# Patient Record
Sex: Male | Born: 1985 | Race: White | Hispanic: No | Marital: Single | Smoking: Former smoker
Health system: Southern US, Academic
[De-identification: ages and names within clinical notes are randomized; demographics above are authoritative.]

## PROBLEM LIST (undated history)

## (undated) ENCOUNTER — Emergency Department (HOSPITAL_COMMUNITY): Admission: EM | Payer: BC Managed Care – PPO | Source: Home / Self Care

## (undated) DIAGNOSIS — R7303 Prediabetes: Secondary | ICD-10-CM

## (undated) HISTORY — PX: HX APPENDECTOMY: SHX54

---
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
## 2011-01-01 ENCOUNTER — Emergency Department (EMERGENCY_DEPARTMENT_HOSPITAL): Payer: BC Managed Care – PPO

## 2011-01-01 ENCOUNTER — Emergency Department
Admission: EM | Admit: 2011-01-01 | Discharge: 2011-01-01 | Disposition: A | Discharge status: Home / Self Care | Payer: BC Managed Care – PPO | Attending: Emergency Medicine-WVUH | Admitting: Emergency Medicine-WVUH

## 2011-01-01 LAB — URINALYSIS MACROSCOPIC WITH REFLEX TO MICROSCOPIC URINALYSIS (CULTURE NOT PERFORMED)
BILIRUBIN: NEGATIVE
BLOOD: NEGATIVE
GLUCOSE: NEGATIVE mg/dL
KETONES: NEGATIVE mg/dL
LEUKOCYTES: NEGATIVE
NITRITE: NEGATIVE
PH URINE: 7 (ref 5.0–8.0)
PROTEIN: NEGATIVE mg/dL
SPECIFIC GRAVITY, URINE: 1.016 (ref 1.005–1.030)
UROBILINOGEN: NORMAL mg/dL

## 2011-01-01 MED ORDER — LIDOCAINE HCL 10 MG/ML (1 %) INJECTION SOLUTION
250.0000 mg | Freq: Once | INTRAMUSCULAR | Status: DC
Start: 2011-01-01 — End: 2011-01-01
  Filled 2011-01-01: qty 2.5

## 2011-01-01 MED ORDER — LIDOCAINE HCL 10 MG/ML (1 %) INJECTION SOLUTION
250.0000 mg | Freq: Once | INTRAMUSCULAR | Status: AC
Start: 2011-01-01 — End: 2011-01-01
  Administered 2011-01-01: 250 mg via INTRAMUSCULAR
  Filled 2011-01-01: qty 2.5

## 2011-01-01 MED ORDER — HYDROCODONE 5 MG-ACETAMINOPHEN 325 MG TABLET
ORAL_TABLET | ORAL | Status: AC
Start: 2011-01-01 — End: 2011-01-01
  Administered 2011-01-01: 1 via ORAL
  Filled 2011-01-01: qty 1

## 2011-01-01 MED ORDER — HYDROCODONE 5 MG-ACETAMINOPHEN 325 MG TABLET
1.0000 | ORAL_TABLET | Freq: Once | ORAL | Status: AC
Start: 2011-01-01 — End: 2011-01-01

## 2011-01-01 MED ORDER — DOXYCYCLINE HYCLATE 100 MG CAPSULE
100.00 mg | ORAL_CAPSULE | Freq: Two times a day (BID) | ORAL | Status: AC
Start: 2011-01-01 — End: 2011-01-11

## 2011-01-01 MED ORDER — HYDROCODONE 5 MG-ACETAMINOPHEN 325 MG TABLET
1.00 | ORAL_TABLET | ORAL | Status: DC | PRN
Start: 2011-01-01 — End: 2012-08-17

## 2011-01-01 NOTE — ED Provider Notes (Addendum)
The following note was written per dictation of Marye Round, PA-C.  Bradd Burner, SCRIBE  Attending: Sheliah Hatch  CC: R groin pain  HPI  25 yo M reports to ED via private vehicle c/o R groin pain. Onset 5 days ago. Pain is pressure-like in nature and associated with dysuria, increased urinary frequency, and urinary urgency. Pt denies penile discharge and scrotal swelling. He does report a swollen nodule on his scrotum. Pt also reports RUQ abdominal pain and nausea for which he was evaluated in an outside facility. No fever, chills, or muscle aches. Pt reports that he is sexually active and has unprotected sex, but only with his girlfriend.       Review of Systems  Constitutional: No fever, chills or muscle aches  Skin: No rash or diaphoresis  GI:  No vomiting. +Nausea, +RUQ abdominal pain  GU:  +R groin pain +Dysuria +Increased Frequency. +Increased Urgency. No penile discharge. No scrotal swelling.          History:   PMH: Denies  PSH: Appendectomy  Social: Pt is sexually active and has unprotected sex, but reports only with his girlfriend.   Allergies: NKDA    Physical Exam  Nursing notes reviewed.    ED Triage Vitals   Enc Vitals Group      BP (Non-Invasive) 01/01/11 1037 106/62 mmHg      Heart Rate 01/01/11 1037 69       Respiratory Rate 01/01/11 1037 16       Temperature 01/01/11 1037 36.8 C (98.2 F)      Temp src --       SpO2-1 01/01/11 1037 97 %      Weight 01/01/11 1037 72.576 kg (160 lb)      Height 01/01/11 1037 1.702 m (5\' 7" )      Head Cir --       Peak Flow --       Pain Score --       Pain Loc --       Pain Edu? --       Excl. in GC? --      Constitutional: NAD. Oriented  Cardiovascular: RRR, No murmurs, rubs or gallops. Intact distal pulses.  Pulmonary/Chest: BS equal bilaterally. No respiratory distress. No wheezes, rales or chest tenderness.   Abdominal: BS +. Abdomen soft. RLQ tenderness without rebound or guarding.               Musculoskeletal: No edema, tenderness or deformity.   GU: No nodule noted. TTP along spermatic chord and R epididymis. No palpable hernia. Scrotum is not swollen. No discoloration or rash.   Skin: warm and dry. No rash, erythema, pallor or cyanosis. Refer to GU.   Psychiatric: normal mood and affect. Behavior is normal.   Neurological: Alert. Grossly intact.        Course  MDM      Impression/Plan: R groin pain concerning for epididymitis and urethritis possibly secondary to STD. Will obtain UA with culture and GC test. Formal US of scrotum ordered.      Orders Placed This Encounter   . URINE CULTURE,ROUTINE   . US SCROTUM   . NEISSERIA GONORRHOEAE DNA BY PCR   . CHLAMYDIA / GU BY PCR   . CANCELED: URINALYSIS W/CULTURE (INPT ROUTINE)   . URINALYSIS W/CULTURE (INPT ROUTINE)   . HYDROcodone-acetaminophen (NORCO) 5-325 mg per tablet   . hydrocodone-Acetaminophen (NORCO) tablet ---[employer]  Reviewed   URINALYSIS W/CULTURE (INPT ROUTINE)   NEISSERIA GONORRHOEAE DNA BY PCR   CHLAMYDIA TRACHOMITIS DNA BY PCR (INHOUSE)     All labs were reviewed.    [place]: No masses. Normal blood flow to both testicles. Epididymis normal size bilaterally.   Fluid around both testicles. No hernia. Cystic head of L epididymis.     Will still treat as an STD. Will give a shot of Rocephin and Doxycycline as a prescription.    Pt will be discharged to home. Pt was instructed to return to the ED for any worsening signs or symptoms. Prescriptions given for Norco and Doxycycline. Instructions about epididymitis provided. Follow up with PCP in 1 week if no improvement.    Clinical Impression: epididymitis      I have reviewed and verified the information in the above note is an accurate dictation of the information supplied to [facility], ED Scribe.  Harlin Heys, PA-C 01/01/2011, 12:30 PM       I was present and available to evaluate this patient while in the emergency department. This is not an attestation of having personally seen and evaluated the patient, but I was available for consultation.  Odis Luster, MD 01/02/2011, 2:49 PM

## 2011-01-01 NOTE — ED Nurses Note (Signed)
Pt medicated as ordered, Pt given d/c instructions, denies any further questions at this time

## 2011-01-01 NOTE — Discharge Instructions (Signed)
Follow up with your primary care provider in 1 week. Return to ED if you cannot follow up with your primary care provider if symptoms do not resolve or worsen. Take medication as prescribed.

## 2011-01-01 NOTE — ED Nurses Note (Signed)
Pt medicated as ordered for 7/10 pain to testicle, denies any further needs at this time,

## 2011-01-03 LAB — CHLAMYDIA TRACHOMITIS DNA BY PCR (INHOUSE)

## 2011-01-03 LAB — NEISSERIA GONORRHOEAE DNA BY PCR

## 2012-08-17 ENCOUNTER — Encounter (INDEPENDENT_AMBULATORY_CARE_PROVIDER_SITE_OTHER): Payer: Self-pay

## 2012-08-17 ENCOUNTER — Ambulatory Visit (INDEPENDENT_AMBULATORY_CARE_PROVIDER_SITE_OTHER): Payer: MEDICAID

## 2012-08-17 VITALS — BP 130/76 | HR 72 | Temp 98.1°F | Resp 16 | Ht 68.27 in | Wt 171.1 lb

## 2012-08-17 MED ORDER — ANTIPYRINE-BENZOCAINE 5.4 %-1.4 % EAR DROPS
OTIC | Status: DC
Start: 2012-08-17 — End: 2015-12-27

## 2012-08-17 MED ORDER — AMOXICILLIN 875 MG-POTASSIUM CLAVULANATE 125 MG TABLET
1.00 | ORAL_TABLET | Freq: Two times a day (BID) | ORAL | Status: DC
Start: 2012-08-17 — End: 2015-12-27

## 2012-08-17 NOTE — Progress Notes (Signed)
Subjective:     Patient ID:  Joshua Jefferson is an 27 y.o. male     Chief Complaint:    Chief Complaint   Patient presents with   . Sinus Pressure   . Ear Pain       Patient is a 27 y.o. male presenting with ear pain and congestion.   Ear Pain   There is pain in both ears. The current episode started 1 to 4 weeks ago. The problem has been unchanged. There has been no fever. The pain is mild. Associated symptoms include coughing and headaches. Pertinent negatives include no diarrhea, rash, sore throat or vomiting. The treatment provided no relief.   Nasal Congestion  This is a new problem. The current episode started more than 1 week ago. The problem has been gradually worsening. Associated symptoms include headaches. Pertinent negatives include no chest pain and no shortness of breath.       Review of Systems   Constitutional: Negative for fever and chills.   HENT: Positive for ear pain and congestion. Negative for sore throat.    Eyes: Negative for pain, discharge and redness.   Respiratory: Positive for cough. Negative for sputum production, shortness of breath and wheezing.    Cardiovascular: Negative for chest pain.   Gastrointestinal: Negative for nausea, vomiting and diarrhea.   Skin: Negative for itching and rash.   Neurological: Positive for headaches.       Objective:     Physical Exam   Constitutional: He appears well-developed and well-nourished.   HENT:   Head: Normocephalic.   Right Ear: Hearing, tympanic membrane, external ear and ear canal normal.   Left Ear: Hearing, tympanic membrane, external ear and ear canal normal.   Nose: Right sinus exhibits maxillary sinus tenderness. Left sinus exhibits maxillary sinus tenderness.   Mouth/Throat: Uvula is midline, oropharynx is clear and moist and mucous membranes are normal. No oropharyngeal exudate or posterior oropharyngeal erythema.   Eyes: Conjunctivae and EOM are normal.   Neck: Normal range of motion. Neck supple.    Cardiovascular: Normal rate, regular rhythm and normal heart sounds.    Pulm:  Effort normal and breath sounds normal.  Observations:  no respiratory distress.    There are no rales present.    There are no wheezes present.    Neurological: He is alert.   Skin: Skin is warm.       .    Vital signs:        Filed Vitals:    08/17/12 1635   BP: 130/76   Pulse: 72   Temp: 36.7 C (98.1 F)   TempSrc: Tympanic   Resp: 16   Height: 1.734 m (5' 8.27")   Weight: 77.61 kg (171 lb 1.6 oz)   SpO2: 98%       I reviewed and confirmed the patient's past medical history taken by the nurse or medical assistant with the addition of the following:    Past Medical History:         History reviewed. No pertinent past medical history.  Past Surgical History:          Past Surgical History   Procedure Laterality Date   . Hx appendectomy       Allergies:      No Known Allergies  Medications:         Current Outpatient Prescriptions   Medication Sig   . amoxicillin-pot clavulanate (AUGMENTIN) 875-125 mg Oral Tablet Take 1 Tab by mouth  Every 12 hours   . antipyrine-benzocaine (AURALGAN) 5.4-1.4 % Otic Drops Instill 3 drops to affected ear every 8 hrs as needed for pain   . [DISCONTINUED] hydrocodone-Acetaminophen (NORCO) 5-325 mg Oral Tablet tablet take 1 Tab by mouth Every 4 hours as needed for Pain.     Social History:         History   Substance Use Topics   . Smoking status: Never Smoker    . Smokeless tobacco: Not on file   . Alcohol Use: No     Family History:       Family History   Problem Relation Age of Onset   . Healthy Mother    . Healthy Father        Data Reviewed     Not applicable    Course:      Condition at discharge: Good     Differential Diagnosis:       Sinusitis vs otitis media vs URI    Orders Placed This Encounter   . amoxicillin-pot clavulanate (AUGMENTIN) 875-125 mg Oral Tablet   . antipyrine-benzocaine (AURALGAN) 5.4-1.4 % Otic Drops              Assessment & Plan:      1. Sinusitis (473.9)  amoxicillin-pot clavulanate (AUGMENTIN) 875-125 mg Oral Tablet   2. Otalgia (388.70)  antipyrine-benzocaine (AURALGAN) 5.4-1.4 % Otic Drops       Go to Emergency Department immediately for further work up if any concerning symptoms.  Plan was discussed and patient verbalized understanding.  If symptoms are worsening or not improving the patient should return to the Urgent Care for further evaluation.  Primus Bravo, MD 08/17/2012, 4:51 PM

## 2015-12-26 DIAGNOSIS — I499 Cardiac arrhythmia, unspecified: Secondary | ICD-10-CM

## 2015-12-26 MED ORDER — SODIUM CHLORIDE 0.9 % (FLUSH) INJECTION SYRINGE
2.00 mL | INJECTION | INTRAMUSCULAR | Status: DC | PRN
Start: 2015-12-26 — End: 2015-12-27

## 2015-12-26 MED ORDER — SODIUM CHLORIDE 0.9 % (FLUSH) INJECTION SYRINGE
2.00 mL | INJECTION | Freq: Three times a day (TID) | INTRAMUSCULAR | Status: DC
Start: 2015-12-27 — End: 2015-12-27
  Administered 2015-12-27: 2 mL
  Administered 2015-12-27: 0 mL

## 2015-12-27 ENCOUNTER — Observation Stay (HOSPITAL_BASED_OUTPATIENT_CLINIC_OR_DEPARTMENT_OTHER): Payer: Self-pay

## 2015-12-27 ENCOUNTER — Encounter (HOSPITAL_COMMUNITY): Payer: Self-pay

## 2015-12-27 ENCOUNTER — Emergency Department (EMERGENCY_DEPARTMENT_HOSPITAL): Payer: Self-pay

## 2015-12-27 ENCOUNTER — Observation Stay
Admission: EM | Admit: 2015-12-27 | Discharge: 2015-12-27 | Disposition: A | Discharge status: Home / Self Care | Payer: Medicaid Other | Attending: Internal Medicine | Admitting: Internal Medicine

## 2015-12-27 ENCOUNTER — Observation Stay (HOSPITAL_COMMUNITY): Payer: Self-pay | Admitting: Internal Medicine

## 2015-12-27 DIAGNOSIS — R0789 Other chest pain: Secondary | ICD-10-CM | POA: Diagnosis present

## 2015-12-27 DIAGNOSIS — Z832 Family history of diseases of the blood and blood-forming organs and certain disorders involving the immune mechanism: Secondary | ICD-10-CM | POA: Insufficient documentation

## 2015-12-27 DIAGNOSIS — Z9049 Acquired absence of other specified parts of digestive tract: Secondary | ICD-10-CM | POA: Insufficient documentation

## 2015-12-27 DIAGNOSIS — J9811 Atelectasis: Secondary | ICD-10-CM

## 2015-12-27 DIAGNOSIS — R079 Chest pain, unspecified: Secondary | ICD-10-CM

## 2015-12-27 DIAGNOSIS — Z8249 Family history of ischemic heart disease and other diseases of the circulatory system: Secondary | ICD-10-CM | POA: Insufficient documentation

## 2015-12-27 DIAGNOSIS — Z87891 Personal history of nicotine dependence: Secondary | ICD-10-CM | POA: Insufficient documentation

## 2015-12-27 HISTORY — DX: Prediabetes: R73.03

## 2015-12-27 LAB — HEPATITIS C ANTIBODY SCREEN WITH REFLEX TO HCV PCR: HCV ANTIBODY QUALITATIVE: NEGATIVE

## 2015-12-27 LAB — POC BLOOD GLUCOSE (RESULTS): GLUCOSE, POC: 82 mg/dL (ref 70–105)

## 2015-12-27 LAB — CBC WITH DIFF
BASOPHIL #: 0.05 x10ˆ3/uL (ref 0.00–0.20)
BASOPHIL %: 1 %
EOSINOPHIL #: 0.29 x10ˆ3/uL (ref 0.00–0.50)
EOSINOPHIL %: 3 %
HCT: 45.5 % (ref 36.7–47.0)
HGB: 15.9 g/dL (ref 12.5–16.3)
HGB: 15.9 g/dL (ref 12.5–16.3)
LYMPHOCYTE #: 4.6 x10ˆ3/uL (ref 1.00–4.80)
LYMPHOCYTE %: 40 %
MCH: 30.4 pg (ref 27.4–33.0)
MCHC: 35 g/dL (ref 32.5–35.8)
MCV: 86.8 fL (ref 78.0–100.0)
MONOCYTE #: 1.02 x10ˆ3/uL — ABNORMAL HIGH (ref 0.30–1.00)
MONOCYTE %: 9 %
MPV: 7.5 fL (ref 7.5–11.5)
NEUTROPHIL #: 5.53 x10ˆ3/uL (ref 1.50–7.70)
NEUTROPHIL %: 48 %
NEUTROPHIL %: 48 %
PLATELETS: 361 x10ˆ3/uL (ref 140–450)
RBC: 5.24 x10ˆ6/uL (ref 4.06–5.63)
RDW: 13.1 % (ref 12.0–15.0)
WBC: 11.5 x10ˆ3/uL — ABNORMAL HIGH (ref 3.5–11.0)

## 2015-12-27 LAB — LIPID PANEL
CHOL/HDL RATIO: 4
CHOL/HDL RATIO: 4
CHOLESTEROL: 116 mg/dL (ref <200)
HDL CHOL: 29 mg/dL — ABNORMAL LOW (ref >=39)
LDL CALC: 59 mg/dL (ref <=100)
NON-HDL: 87 mg/dL (ref <=190)
TRIGLYCERIDES: 138 mg/dL (ref <=150)
VLDL CALC: 28 mg/dL (ref <30)

## 2015-12-27 LAB — PT/INR
INR: 1.02 (ref 0.80–1.20)
PROTHROMBIN TIME: 11.5 s (ref 9.0–13.6)

## 2015-12-27 LAB — BASIC METABOLIC PANEL
ANION GAP: 9 mmol/L (ref 4–13)
BUN/CREA RATIO: 12 (ref 6–22)
BUN: 13 mg/dL (ref 8–25)
CALCIUM: 9.7 mg/dL (ref 8.5–10.2)
CHLORIDE: 104 mmol/L (ref 96–111)
CO2 TOTAL: 27 mmol/L (ref 22–32)
CREATININE: 1.12 mg/dL (ref 0.62–1.27)
ESTIMATED GFR: >59 mL/min/1.73mˆ2 (ref >59)
GLUCOSE: 87 mg/dL (ref 65–139)
POTASSIUM: 3.8 mmol/L (ref 3.5–5.1)
SODIUM: 140 mmol/L (ref 136–145)

## 2015-12-27 LAB — C-REACTIVE PROTEIN(CRP),HIGH SENSITIVITY,CARDIAC: CRP CARDIAC MARKER: 1.5 mg/L — ABNORMAL HIGH (ref <=1.0)

## 2015-12-27 LAB — TROPONIN-I (FOR ED ONLY): TROPONIN I: <7 ng/L (ref 0–30)

## 2015-12-27 LAB — ECG 12-LEAD
Atrial Rate: 69 {beats}/min
Calculated P Axis: 68 degrees
Calculated R Axis: 95 degrees
Calculated T Axis: 57 degrees
QRS Duration: 98 ms
QT Interval: 396 ms
QTC Calculation: 424 ms
Ventricular rate: 69 {beats}/min

## 2015-12-27 LAB — TROPONIN-I
TROPONIN I: <7 ng/L (ref 0–30)
TROPONIN I: <7 ng/L (ref 0–30)

## 2015-12-27 LAB — HGA1C (HEMOGLOBIN A1C WITH EST AVG GLUCOSE)
ESTIMATED AVERAGE GLUCOSE: 100 mg/dL
HEMOGLOBIN A1C: 5.1 % (ref 4.0–6.0)

## 2015-12-27 LAB — SEDIMENTATION RATE: ERYTHROCYTE SEDIMENTATION RATE (ESR): 4 mm/h (ref 0–15)

## 2015-12-27 MED ORDER — OXYCODONE-ACETAMINOPHEN 5 MG-325 MG TABLET
1.0000 | ORAL_TABLET | Freq: Four times a day (QID) | ORAL | Status: DC | PRN
Start: 2015-12-27 — End: 2015-12-27

## 2015-12-27 MED ORDER — SODIUM CHLORIDE 0.9 % (FLUSH) INJECTION SYRINGE
2.0000 mL | INJECTION | Freq: Three times a day (TID) | INTRAMUSCULAR | Status: DC
Start: 2015-12-27 — End: 2015-12-27

## 2015-12-27 MED ORDER — POTASSIUM CHLORIDE 10 MEQ/L IN DEXTROSE 5 %-0.45 % SODIUM CHLORIDE IV
INTRAVENOUS | Status: DC
Start: 2015-12-27 — End: 2015-12-27

## 2015-12-27 MED ORDER — OXYCODONE-ACETAMINOPHEN 5 MG-325 MG TABLET
2.0000 | ORAL_TABLET | Freq: Four times a day (QID) | ORAL | Status: DC | PRN
Start: 2015-12-27 — End: 2015-12-27

## 2015-12-27 MED ORDER — PANTOPRAZOLE 40 MG TABLET,DELAYED RELEASE
40.0000 mg | DELAYED_RELEASE_TABLET | Freq: Every morning | ORAL | Status: DC
Start: 2015-12-27 — End: 2015-12-27
  Filled 2015-12-27 (×2): qty 1

## 2015-12-27 MED ORDER — NITROGLYCERIN 0.4 MG SUBLINGUAL TABLET
0.40 mg | SUBLINGUAL_TABLET | SUBLINGUAL | Status: DC | PRN
Start: 2015-12-27 — End: 2015-12-27

## 2015-12-27 MED ORDER — PANTOPRAZOLE 40 MG TABLET,DELAYED RELEASE
40.00 mg | DELAYED_RELEASE_TABLET | Freq: Every morning | ORAL | 0 refills | Status: AC
Start: 2015-12-27 — End: 2016-01-26

## 2015-12-27 MED ORDER — SODIUM CHLORIDE 0.9 % (FLUSH) INJECTION SYRINGE
2.0000 mL | INJECTION | INTRAMUSCULAR | Status: DC | PRN
Start: 2015-12-27 — End: 2015-12-27

## 2015-12-27 MED ORDER — SENNOSIDES 8.6 MG-DOCUSATE SODIUM 50 MG TABLET
1.0000 | ORAL_TABLET | Freq: Two times a day (BID) | ORAL | Status: DC | PRN
Start: 2015-12-27 — End: 2015-12-27

## 2015-12-27 MED ORDER — ENOXAPARIN 40 MG/0.4 ML SUBCUTANEOUS SYRINGE
40.0000 mg | INJECTION | SUBCUTANEOUS | Status: DC
Start: 2015-12-27 — End: 2015-12-27
  Administered 2015-12-27: 40 mg via SUBCUTANEOUS
  Filled 2015-12-27: qty 0

## 2015-12-27 MED ORDER — SODIUM CHLORIDE 0.9 % IV BOLUS
1000.0000 mL | INJECTION | Status: AC
Start: 2015-12-27 — End: 2015-12-27
  Administered 2015-12-27: 01:00:00 1000 mL via INTRAVENOUS
  Administered 2015-12-27: 0 mL via INTRAVENOUS

## 2015-12-27 MED ORDER — NITROGLYCERIN 0.4 MG SUBLINGUAL TABLET
0.4000 mg | SUBLINGUAL_TABLET | SUBLINGUAL | Status: AC
Start: 2015-12-27 — End: 2015-12-27
  Administered 2015-12-27 (×3): 0.4 mg via SUBLINGUAL
  Filled 2015-12-27: qty 25

## 2015-12-27 MED ORDER — ASPIRIN 81 MG CHEWABLE TABLET
244.00 mg | CHEWABLE_TABLET | ORAL | Status: AC
Start: 2015-12-27 — End: 2015-12-27
  Administered 2015-12-27: 243 mg via ORAL
  Filled 2015-12-27: qty 4

## 2015-12-27 MED ORDER — IOVERSOL 350 MG IODINE/ML INTRAVENOUS SOLUTION
80.00 mL | INTRAVENOUS | Status: AC
Start: 2015-12-27 — End: 2015-12-27
  Administered 2015-12-27: 80 mL via INTRAVENOUS

## 2015-12-27 MED ORDER — IOVERSOL 350 MG IODINE/ML INTRAVENOUS SOLUTION
50.00 mL | INTRAVENOUS | Status: DC
Start: 2015-12-27 — End: 2015-12-27

## 2015-12-27 MED ORDER — ASPIRIN 81 MG CHEWABLE TABLET
81.0000 mg | CHEWABLE_TABLET | Freq: Every day | ORAL | Status: DC
Start: 2015-12-27 — End: 2015-12-27
  Administered 2015-12-27: 81 mg via ORAL
  Filled 2015-12-27: qty 1

## 2015-12-27 MED ORDER — ONDANSETRON HCL (PF) 4 MG/2 ML INJECTION SOLUTION
4.0000 mg | INTRAMUSCULAR | Status: AC
Start: 2015-12-27 — End: 2015-12-27
  Administered 2015-12-27: 01:00:00 4 mg via INTRAVENOUS
  Filled 2015-12-27: qty 2

## 2015-12-27 MED ORDER — KETOROLAC 30 MG/ML (1 ML) INJECTION SOLUTION
30.0000 mg | INTRAMUSCULAR | Status: DC
Start: 2015-12-27 — End: 2015-12-27

## 2015-12-27 MED ADMIN — ioversoL 350 mg iodine/mL intravenous solution: INTRAVENOUS | @ 08:00:00

## 2015-12-27 NOTE — ED Nurses Note (Signed)
X-ray at bedside.     Rebekah ChesterfieldKristin Renee ArpKoper, LPN  2/[id number]/62137/05/2016, 00:19

## 2015-12-27 NOTE — ED Provider Notes (Signed)
Department of Emergency Medicine  Attending provider: Dr. Zada Girt  APP: Gwynne Edinger, APRN    HPI:  CC:  Chest pain  Joshua Jefferson is a 30 y.o. male who presents via POV, with complaint chest pain. Patient states the pain started at 5 o'clock this evening after returning from work.  States the pain is located to the left side of his chest, radiates to the left side of his neck, and down his left arm.  Associated symptoms include shortness of breath.  Denies fever, abdominal pain, nausea, or vomiting. Denies recent car trips, or surgeries, or hemoptysis.  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  The patient has strong family history of cardiac disease including first-degree family members have been diagnosed with cardiac disease in the 30s. Past surgical history includes appendectomy.  Patient is a former smoker, occasional EtOH use and denies illicit substance abuse.  Patient is noted to have multiple tattoos, states he has received a in a tattoo parlor-have never been incarcerated.  Patient took aspirin prior to arrival to the emergency department with no improvement in pain.      ROS:  Constitutional: No fever or chills   Skin: No rash or diaphoresis  HENT: No headaches or congestion  Eyes: No vision changes   Cardio:  + chest pain  Respiratory: No cough or SOB  GI:  No nausea, vomiting, abdominal pain or stool changes  GU:  No urinary changes  MSK: No joint or back pain  Neuro: No numbness, tingling, or weakness  All other systems reviewed and are negative.      History: PMH:    Past Medical History:   Diagnosis Date    Prediabetes          PSH:    Past Surgical History:   Procedure Laterality Date    HX APPENDECTOMY           Social Hx:    Social History     Social History    Marital status: Single     Spouse name: N/A    Number of children: N/A    Years of education: N/A     Occupational History    welder      Social History Main Topics    Smoking status: Former Smoker     Packs/day: 1.00     Years:  3.00     Quit date: 2005    Smokeless tobacco: Not on file    Alcohol use Yes      Comment: occasional    Drug use: No    Sexual activity: Not on file     Other Topics Concern    Not on file     Social History Narrative    Works as Psychologist, occupational.     Family Hx:   Family History   Problem Relation Age of Onset    Coronary Artery Disease Mother     Blood Clots Father 103     DVT     Allergies: No Known Allergies    Above history reviewed with patient, changes are as documented.      PE:   Nursing note and vitals reviewed.    Vitals:    12/27/15 0200 12/27/15 0400 12/27/15 0501 12/27/15 0753   BP: 121/64 111/65 111/71 115/70   Pulse: 61 53 55 51   Resp: Temp:  36.8 C (98.3 F) 36.6 C (97.9 F) 36.6 C (97.9 F)   SpO2:  96% 97% 97%   Weight:   79.2 kg (174 lb 9.7 oz)    Height:   1.702 m (5\' 7" )        Constitutional: Pt appears well-developed and well-nourished. No acute distress.   HENT:   Mouth/Throat: Moist mucus membranes.   Eyes: Conjunctivae are normal. Pupils are equal, round, and reactive to light.   Neck: Normal range of motion. Neck supple.   Cardiovascular: Normal rate, regular rhythm and intact distal pulses.    Pulmonary/Chest: Effort normal and breath sounds normal. No respiratory distress. No wheezes or rales.   Abdominal: Abdomen is soft, nontender, nondistended. Bowel sounds present.   Musculoskeletal: Normal range of motion.   Neurological: Pt is alert and oriented.  Grossly intact.  *    Skin: Skin is warm and dry without rash.  Color normal.            MDM      Impression/Plan: 30 y.o. male presenting with chest pain . Medical Records reviewed.     Will obtain the following labs/imaging and give pt the following medications to alleviate symptoms:   Orders Placed This Encounter    XR AP MOBILE CHEST    CANCELED: CT CHEST FOR PULMONARY EMBOLUS W IV CONTRAST    CT CHEST FOR PULMONARY EMBOLUS W IV CONTRAST    CBC/DIFF    BASIC METABOLIC PANEL, NON-FASTING    PT/INR    PTT (PARTIAL  THROMBOPLASTIN TIME)    TROPONIN-I (FOR ED ONLY)    HIV1/HIV2 SCREEN, COMBINED ANTIGEN AND ANTIBODY    CBC WITH DIFF    CANCELED: URINALYSIS, MACROSCOPIC AND MICROSCOPIC W/CULTURE REFLEX    CANCELED: DRUG SCREEN, LOW OPIATE CUTOFF, NO CONFIRMATION, URINE    CANCELED: URINALYSIS, MACROSCOPIC    CANCELED: TROPONIN-I    CANCELED: DRUG SCREEN, LOW OPIATE CUTOFF, WITH CONFIRMATION, URINE    HGA1C (HEMOGLOBIN A1C WITH EST AVG GLUCOSE)    LIPID PANEL    C-REACTIVE PROTEIN(CRP),HIGH SENSITIVITY,CARDIAC    SEDIMENTATION RATE    CANCELED: HEPATITIS C ANTIBODY SCREEN WITH REFLEX TO HCV PCR    CANCELED: HEPATITIS C ANTIBODY SCREEN WITH REFLEX TO HCV PCR    HEPATITIS C ANTIBODY SCREEN WITH REFLEX TO HCV PCR    CANCELED: OXYGEN - NASAL CANNULA    ECG 12-LEAD    CANCELED: POCT WHOLE BLOOD GLUCOSE Q6H ( FOR NPO PATIENTS)    CANCELED: INSERT & MAINTAIN PERIPHERAL IV ACCESS    CANCELED: INSERT & MAINTAIN PERIPHERAL IV ACCESS    PATIENT CLASS/LEVEL OF CARE DESIGNATION    NS bolus infusion 1,000 mL    ondansetron (ZOFRAN) 2 mg/mL injection    aspirin chewable tablet 244 mg    nitroGLYCERIN (NITROSTAT) sublingual tablet    ioversol (OPTIRAY 350) infusion    pantoprazole (PROTONIX) 40 mg Oral Tablet, Delayed Release (E.C.)       Labs Reviewed   PTT (PARTIAL THROMBOPLASTIN TIME) - Abnormal; Notable for the following:        Result Value    APTT 41.4 (*)     All other components within normal limits    Narrative:     Therapeutic range for unfractionated heparin is 60.0-100.0 seconds.   CBC WITH DIFF - Abnormal; Notable for the following:     WBC 11.5 (*)     MONOCYTE # 1.02 (*)     All other components within normal limits   LIPID PANEL - Abnormal; Notable for the following:     HDL CHOL 29 (*)  All other components within normal limits   C-REACTIVE PROTEIN(CRP),HIGH SENSITIVITY,CARDIAC - Abnormal; Notable for the following:     CRP CARDIAC MARKER 1.5 (*)     All other components within normal limits   BASIC  METABOLIC PANEL - Normal   PT/INR - Normal    Narrative:     Coumadin therapy INR range for Conventional Anticoagulation is 2.0 to 3.0 and for Intensive Anticoagulation 2.5 to 3.5.   TROPONIN-I (FOR ED ONLY) - Normal   HIV1/HIV2 SCREEN, COMBINED ANTIGEN AND ANTIBODY - Normal   TROPONIN-I - Normal   SEDIMENTATION RATE - Normal   CBC/DIFF    Narrative:     The following orders were created for panel order CBC/DIFF.  Procedure                               Abnormality         Status                     ---------                               -----------         ------                     CBC WITH [id number]                 Abnormal            Final result                 Please view results for these tests on the individual orders.   HGA1C (HEMOGLOBIN A1C WITH EST AVG GLUCOSE)    Narrative:     ADA 2009 recommendations suggest lowering A1C to below or around 7% to reduce microvascular and neuropathic complications of type 1 and type 2 diabetes.  There are no age/gender specific ranges for eAG.     All labs were reviewed.    Radiographical Imaging:  Results for orders placed or performed during the hospital encounter of 12/27/15 (from the past 72 hour(s))   XR AP MOBILE CHEST     Status: None    Narrative    Joshua Jefferson  Male, 30 years old.    XR AP MOBILE CHEST performed on 12/27/2015 12:10 AM.    REASON FOR EXAM:  Chest Pain    TECHNIQUE: 1 view/1 image(s) submitted for interpretation.    COMPARISON: None available.    FINDINGS:  No focal consolidation, pleural effusion, or pneumothorax is   identified. The heart is of normal size.  No acute osseous abnormalities   are appreciated.      Impression    No acute cardiopulmonary process.     CT CHEST FOR PULMONARY EMBOLUS W IV CONTRAST     Status: None    Narrative    Joshua Jefferson  Male, 30 years old.    CT CHEST FOR PULMONARY EMBOLUS W IV CONTRAST performed on 12/27/2015 7:34   AM.    REASON FOR EXAM:  chest pain, evaluate for pulmonary  embolism    TECHNIQUE: Axial images from the thoracic inlet through the lungs were   obtained with reformatted coronal and sagittal images.    COMPARISON: Radiograph of the chest dated December 27, 2015.    FINDINGS:  LUNGS: There is no focal consolidation or evidence of generalized   interstitial disease. A small amount of dependent atelectasis is noted   bilaterally. There are no suspicious pulmonary nodules. No bronchiectasis   is present. The central airway is widely patent.    MEDIASTINUM: There is no mediastinal mass or suspicious mediastinal or   hilar lymph nodes. The thyroid is within normal limits. The esophagus is   decompressed.    HEART AND GREAT VESSELS: The cardiac size is within normal limits, as are   the diameters of the aorta and main pulmonary arteries. No filling defect   is identified within the pulmonary arteries to suggest pulmonary embolus.   There is no pericardial effusion.    PLEURA: No pleural effusion or pneumothorax is present.    ABDOMEN: Included portions of the upper abdomen are unremarkable.    OTHER:No acute osseous abnormality is seen. The soft tissues are   unremarkable      Impression    No acute cardiopulmonary process, specifically no evidence of pulmonary   embolus.         Course:   Pt remained vitally stable throughout ED visit.   Tolerating PO, non-toxic appearing.   Clinical story is suspicious for ACS, especially in the setting of 1st degree relatives with cardiac disease.  PERC criteria negative for pulmonary embolism and is unlikely source of symptoms.   Given concerning clinical story medicine team was consulted for admission.  They agree to admit for further observation and evaluation.    PERC Rule for Pulmonary Embolism    Low probability of PE---YES  Age > or equal to 30yo---no  Tachycardia ( > 100bpm)---no  Hypoxemia ( O2 sat < 95%)---no  Prior history of DVT or PE---no  Recent trauma or surgery---no  Hemoptysis---no  Exogenous estrogen---no  Unilateral leg  swelling---no    PE ruled out?---yes    Following the above history, physical exam, and studies, and after review of the patient's previous medical record, the patient was admitted to Medicine for further observation, evaluation, and treatment.       Disposition: Admitted  Clinical Impression:   Encounter Diagnosis   Name Primary?    Atypical chest pain Yes     Follow Up:   No follow-up provider specified.  Prescriptions:   Discharge Medication List as of 12/27/2015 12:12 PM      START taking these medications    Details   pantoprazole (PROTONIX) 40 mg Oral Tablet, Delayed Release (E.C.) Take 1 Tab (40 mg total) by mouth Every morning before breakfast for 30 days, Disp-30 Tab, R-0, E-Rx                 Donell Sievert, APRN    The supervising physician was physically present and available for consultation, and did  physically see the patient.    Donell Sievert, APRN  12/28/2015, 02:37    This note was completed as a late entry due to the time restrictions during my clinical shift.

## 2015-12-27 NOTE — Nurses Notes (Signed)
Pt admitted to observation for complaints of chest pain, medically cleared for discharge by Dr Vonita MossSteward. Pt is alert and oriented to person, place and time. Denies any chest pain or pressure. No shortness of breath or distress noted. IV removed, tolerated well. All discharge instruction reviewed with patient and spouse, was able to verbalize understanding all  Information. All personal belongings returned.

## 2015-12-27 NOTE — Progress Notes (Signed)
[facility]  Discharge Day Note    Joshua GuildJustin Tyler Jefferson  Date of service: 12/27/2015  Date of Admission:  12/27/2015  Hospital Day:  LOS: 0 days       Examination at discharge:  Patient laying in bed.  States no chest pain or shortness of breath.    Vital Signs:  Temperature: 36.6 C (97.9 F) (12/27/15 0753)  Heart Rate: 51 (12/27/15 0753)  BP (Non-Invasive): 115/70 (12/27/15 0753)  Respiratory Rate: 15 (12/27/15 0753)  SpO2-1: 97 % (12/27/15 0753)  Pain Score (Numeric, Faces): 1 (12/27/15 0800)  Constitutional:  no distress and vital signs reviewed  Respiratory:  Clear to auscultation bilaterally.   Cardiovascular:  regular rate and rhythm  Gastrointestinal:  Soft, non-tender  Musculoskeletal:  AROM  Integumentary:  Skin warm and dry    Assessment/ Plan:   Active Hospital Problems    Diagnosis    Atypical chest pain   Atypical chest pain  - resolved  -  costochondritis vs GERD   - family history of father with recurrent DVT starting at 30 yo, recent long car trips with reported RLE edema/ecchymosis (since resolved) raises concern for PE  - EKG without acute ischemic changes  - troponin flat  -  CT chest to evaluate for PE normal  -  can consider exercise treadmill stress test by PCP as outpatient if chest pain occurs  - started PPI    Disposition : Home discharge    Lay Caregiver Name: Sherre Lainkatlyn keezee, Lay Caregiver Contact Number: [phone], Lay Caregiver Relationship to patient: spouse/significant other        The patient was seen independently with the co-signing faculty present in hospital.            Nelle DonGregory P. Sallee LangeSelasky,  PA-C 12/27/2015    13:49      I reviewed the physician assistant note. I have discussed the case with the PA. I agree with the findings and plan of care as documented in the physician assistant's note.  Any exceptions/additions are edited/noted.    Linton FlemingsBryan Michael Haroon Shatto, MD 12/27/2015, 16:34

## 2015-12-27 NOTE — Care Management Notes (Signed)
[facility]  Care Management Initial Evaluation    Patient Name: Joshua GuildJustin Tyler Jefferson  Date of Birth: [birth year]  Sex: male  Date/Time of Admission: 12/27/2015 12:01 AM  Room/Bed: 522/A  Payor: /   PCP: No Established Pcp    Pharmacy Info:   Preferred Pharmacy     [id number] [place], [place] - [address]    [address] [place] [id number]    Phone: [phone] Fax: [phone]    Not a 24 hour pharmacy; exact hours not known        Emergency Contact Info:   Extended Emergency Contact Information  Primary Emergency Contact: Starleen BlueMARTIN, ROBERT   [place]  Home Phone: [phone]  Work Phone: [phone]  Mobile Phone: [phone]  Relation: Father    History:   Joshua Jefferson is a 30 y.o., male, admitted with Atypical chest pain.     Height/Weight: 170.2 cm (5\' 7" ) / 79.2 kg (174 lb 9.7 oz)     LOS: 0 days   Admitting Diagnosis: Atypical chest pain [R07.89]    Assessment:      12/27/15 1410   Assessment Details   Assessment Type Admission   Date of Care Management Update 12/27/15   Date of Next DCP Update 12/28/15   Readmission   Is this a readmission? No   Care Management Plan   Discharge Planning Status initial meeting   Projected Discharge Date 12/27/15   CM will evaluate for rehabilitation potential no   Patient choice offered to patient/family no   Form for patient choice reviewed/signed and on chart no   Patient aware of possible cost for ambulance transport?  No   Discharge Needs Assessment   Equipment Currently Used at Home none   Equipment Needed After Discharge none   Discharge Facility/Level Of Care Needs Home (Patient/Family Member/other)(code 1)   Transportation Available car;family or friend will provide   Referral Information   Admission Type observation   Address Verified verified-no changes   Arrived From home or self-care   Insurance Verified (Patient states he has BC/BS but does not have insurance cards yet.)   Observation Form    Observation Form Given Non Medicare OBS form given   Non Medicare OBS form given to Deirdre PeerJustin Beveridge   Non Medicare OBS form date of delivery 12/27/15   Non Medicare OBS form time of delivery [phone]   ADVANCE DIRECTIVES   Does the Patient have an Advance Directive? No, Information Offered and Refused   Patient Requests Assistance in Having Advance Directive Notarized. N/A   Living Environment   Lives With significant other;child(ren), dependent   Living Arrangements apartment   Home Safety   Home Assessment: No Problems Identified   Home Accessibility no concerns   Joshua GuildJustin Tyler Jefferson is a 30 y.o., White male with PMH prediabetes [facility] with c/o left sided chest pain.    Discharge Plan:  Home (Patient/Family Member/other) (code 1)  Patient resides with his significant other and daughter in their 1 floor apartment. No DME or Home Health services prior to admission. Patient has prescription coverage and transportation home upon discharge.     The patient will continue to be evaluated for developing discharge needs.     Case Manager: Heber CarolinaDaniel Francely Craw, LGSW  Phone: [phone] for [facility]

## 2015-12-27 NOTE — ED Nurses Note (Signed)
Admitting service at bedside.     Rebekah ChesterfieldKristin Renee PlymouthKoper, LPN  8/65/78467/05/2016, 03:17

## 2015-12-27 NOTE — ED Nurses Note (Signed)
[facility] recorded on #[id number]  For 5W  Room 516

## 2015-12-27 NOTE — ED Nurses Note (Addendum)
EKG at bedside. Patient  reminded that urine specimens is needed.     Joshua ChesterfieldKristin Renee [place]oper, LPN  8/11/91477/05/2016, 00:22

## 2015-12-27 NOTE — ED Nurses Note (Signed)
Patient comes to the ED with c/o chest pain that began around 1700 after he got home from work.  Patient states that this has never happened to him before- denies any recent illness or injury, denies any fever, chills, n/v, diarrhea, constipation, or other symptoms.  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  Patient states that the chest pain is located in his upper chest and at times radiates around into his left upper rib cage. Patient's PMH, medications, and allergies reviewed at bedside.  20 gauge PIV placed to patient's right AC where blood specimens were obtained and walked to lab as per protocol. Patient placed on cardiac monitor,blood pressure cuff, and pulse oximeter.  Med student at bedside.     Rebekah ChesterfieldKristin Renee [place], LPN  1/19/14787/05/2016, 00:13

## 2015-12-27 NOTE — ED Attending Note (Signed)
Note begun by: Rushie Goltzwen M Derriana Oser, MD on 12/27/2015 at 3:00 AM  I was physically present and directly supervised this patient's care.  Patient seen and examined.  Resident / Trixie DredgeMidlevel / NP history and exam reviewed.   Key elements in addition to and/or correction of that documentation are as follows:    HPI :    30 y.o. y.o. male presents with chief complaint of chest pain that radiates to his left arm and shoulder as well as neck.  There is associated shortness of breath no fevers chills cough or recent illness.  No history of prior cardiac illness.  Significant past medical history of early CAD in first-degree relative.  PE :   VS on presentation: Temperature: 37.1 C (98.7 F)  Heart Rate: 77  Respiratory Rate: 18  BP (Non-Invasive): 124/72  SpO2-1: 98 %  Oxygen Therapy  SpO2-1: 98 %  HR-SpO2: 78 bpm    Awake, alert, fluent historian  Head normocephalic and atraumatic  Mucous membranes moist  Neck supple without midline tenderness  Lungs clear and work of breathing normal  Distal pulses equal and full  Abdomen soft and non tender  Skin warm, dry and well perfused  Moves all extremities well      Data/Test :    EKG :  Please see EKG  Images Personally Reviewed : None  Image Reports Reviewed:    Labs:  Laboratory values obtained as documented in the patients chart and the resident note.  Notable lab values:      Review of Prior Data :       Prior Images : None  Prior EKG : None  Online Medical Records:  None  Transfer Docs/Images:  None    Initial Assessment:  Per APP Craft    MDM/Plan:    Per APP Craft    ED Course:   Per APP Craft      PROCEDURES:      Disposition: Admitted    Clinical Impression:     Encounter Diagnosis   Name Primary?    Atypical chest pain Yes       CRITICAL CARE : None    This note was completed after the conclusion of care given the need for direct patient care at the time of service.

## 2015-12-27 NOTE — ED Nurses Note (Signed)
ED provider at bedside discussing plan of care with patient and likely hood for admission.     Rebekah ChesterfieldKristin Renee [place] Lake[place], LPN  5/40/98117/05/2016, 01:09

## 2015-12-27 NOTE — Nurses Notes (Signed)
Patient admitted to OBSU bed 522 from ED .  Patient alert and oriented to unit. Patient educated to call out for assistance when needed. Patient states understanding, call bell within reach. Admission completed.

## 2015-12-27 NOTE — ED Nurses Note (Signed)
Patient is resting with no concerns/needs voiced.    Rebekah ChesterfieldKristin Renee [place], LPN  1/61/09607/05/2016, 04:06

## 2015-12-27 NOTE — Discharge Summary (Signed)
DISCHARGE SUMMARY      PATIENT NAME:  Joshua Jefferson,Joshua Jefferson  MRN:  [id number]  DOB:  November 25, 1985    ADMISSION DATE:  12/27/2015  DISCHARGE DATE:  12/27/2015    ATTENDING PHYSICIAN: Linton FlemingsSteward, Vere Diantonio Michael, *  SERVICE: MED HOSPITALIST 8  PRIMARY CARE PHYSICIAN: No Established Pcp     Reason for Admission     Diagnosis    Atypical chest pain [197556]          DISCHARGE DIAGNOSIS:     Principle Problem:  Atypical chest pain    Active Hospital Problems    Diagnosis Date Noted    Atypical chest pain 12/27/2015      Resolved Hospital Problems    Diagnosis    No resolved problems to display.     There are no active non-hospital problems to display for this patient.     No Known Allergies     DISCHARGE MEDICATIONS:     Current Discharge Medication List      START taking these medications.       Details    pantoprazole 40 mg Tablet, Delayed Release (E.C.)   Commonly known as:  PROTONIX    40 mg, Oral, Daily before Breakfast   Qty:  30 Tab   Refills:  0           Discharge med list refreshed?  YES    During this hospitalization did the patient have an AMI, PCI/PCTA, STENT or Isolated CABG?  No                DISCHARGE INSTRUCTIONS:      RETURN TO WORK/SCHOOL   Patient May Return to Work: 12/28/2015    Activity Limitations: None    Patient Was in the Care of a Physician on the Following Dates: 12/26/2015- 12/27/2015             Significant findings, procedures and treatment provided:  Atypical chest pain  - resolved  -  costochondritis vs GERD   - family history of father with recurrent DVT starting at 30 yo, recent long car trips with reported RLE edema/ecchymosis (since resolved) raises concern for PE  - EKG without acute ischemic changes  - troponin flat  -  CT chest to evaluate for PE normal  -  can consider exercise treadmill stress test by PCP as outpatient if chest pain occurs  - started PPI      CONDITION ON DISCHARGE:  A. Ambulation: Full ambulation  B. Self-care Ability: Complete  C. Cognitive Status Alert and Oriented x 3  D.  DNR status at discharge: Full Code    Advance Directive Information       Most Recent Value    Does the Patient have an Advance Directive? No, Information Offered and Refused          DISCHARGE DISPOSITION:  Home discharge          The patient was seen independently with the co-signing faculty present in hospital.        Nelle DonGregory P. Hannah BeatSelasky,  PA-C 12/27/2015    13:51        Copies sent to Care Team       Relationship Specialty Notifications Start End    Pcp, No Established PCP - General   01/01/11           Referring providers can utilize [email or web address] to access their referred [facility] patient's information.

## 2015-12-27 NOTE — H&P (Addendum)
[place] [facility]   Admission H&P    Date of Service:  12/27/2015  Joshua Jefferson, Joshua Jefferson, 30 y.o. male  Date of Admission:  12/27/2015  Date of Birth:  1985/06/22  PCP: No Established Pcp          Information Obtained from: patient, health care provider and history reviewed via medical record  Chief Complaint:  Chest pain    HPI: Joshua Jefferson is a 30 y.o., White male with PMH prediabetes presents to [facility] ED with c/o left sided chest pain. Pain started at 1830 while driving home from a parade. Pain 8/10 and sharp in nature. Radiated to left upper back. Pain worse with palpation and better with rest. No other alleviating/aggravating factors. Reports associated dyspnea and nausea without vomiting. No similar episodes in the past. Reports he had been drinking 3.5-4 hours per day to work in [place] and last made the trip on 12/07/15. Reports at about that time he noticed some swelling in his right posterior calf with ecchymosis. This swelling and bruising has since resolved. 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 Reports father has had recurrent DVT's first at 4 yo. Mother has CAD without h/o MI. Remote h/o smoking 1 ppd x 3 years but quit in ~2005. No drug use. Occasional alcohol use. Reports nitroglycerin in ED made pain better. No fevers, chills, palpitations, or other complaints. No other pertinent positive ROS.    PAST MEDICAL:    Past Medical History:   Diagnosis Date    Prediabetes         Past Surgical History:   Procedure Laterality Date    HX APPENDECTOMY              Medications Prior to Admission    None    No Known Allergies      Family History  Family History   Problem Relation Age of Onset    Coronary Artery Disease Mother     Blood Clots Father 71     DVT       Social History  Social History     Social History    Marital status: Single     Spouse name: N/A     Number of children: N/A    Years of education: N/A     Occupational History    welder      Social History Main Topics    Smoking status: Former Smoker     Packs/day: 1.00     Years: 3.00     Quit date: 2005    Smokeless tobacco: Not on file    Alcohol use Yes      Comment: occasional    Drug use: No    Sexual activity: Not on file     Other Topics Concern    Not on file     Social History Narrative    Works as Building control surveyor.       ROS:     Constitutional: negative for fevers and chills  Eyes: negative for visual disturbance and irritation  Ears, nose, mouth, throat, and face: negative for hearing loss and tinnitus  Respiratory: positive for intermittent dry cough and dyspnea with episode of chest pain  Cardiovascular: positive for chest pain as per HPI, negative for palpitations  Gastrointestinal: negative for nausea, vomiting and diarrhea  Genitourinary:negative for frequency and dysuria  Integument/breast:  negative for rash and skin lesion(s)  Hematologic/lymphatic: negative for easy bruising and bleeding  Musculoskeletal:negative for myalgias and arthralgias  Neurological: negative for headaches, coordination problems, gait problems and weakness  Behavioral/Psych: negative for anxiety and depression  Endocrine: negative for diabetic symptoms including polyuria and polydipsia  All other ROS Negative    Examination:  Temperature: 37.1 C (98.7 F) Heart Rate: 61 BP (Non-Invasive): 121/64   Respiratory Rate: 12 SpO2-1: 94 % Pain Score (Numeric, Faces): 0   General- alert, NAD, appears older than stated age  Head- AT/NC  ENT- mucous membranes moist, piercing in nasal septum, large piercing in b/l ears  Eyes- pupils equal and round, sclera nonicteric  Heart- RRR, no murmurs, gallops or rubs, TTP left upper precordium with reproduction of pain  Lungs- CTAB with good air exchange  Abdomen- soft, NT/ND, BS normal  Skin- warm and dry, no rashes, multiple tattoos present  Extremities- no cyanosis or edema  MSK- no gross  deformities or obvious signs of atrophy  Neurologic- AOx3, CN 2-12 grossly intact, no gross focal deficits  Psychiatric- speech normal, affect appropriate    [place]: Eye opening: 4 spontaneous, Verbal resonse:  5 oriented, Best motor response:  6 obeys commands  Coma (GCS Coma less than 8): Coma -Not applicable    Labs:    I have reviewed all lab results.  Lab Results for Last 24 Hours:    Results for orders placed or performed during the hospital encounter of 12/27/15 (from the past 24 hour(s))   BASIC METABOLIC PANEL, NON-FASTING   Result Value Ref Range    SODIUM 140 136 - 145 mmol/L    POTASSIUM 3.8 3.5 - 5.1 mmol/L    CHLORIDE 104 96 - 111 mmol/L    CO2 TOTAL 27 22 - 32 mmol/L    ANION GAP 9 4 - 13 mmol/L    CALCIUM 9.7 8.5 - 10.2 mg/dL    GLUCOSE 87 65 - 139 mg/dL    BUN 13 8 - 25 mg/dL    CREATININE 1.12 0.62 - 1.27 mg/dL    BUN/CREA RATIO 12 6 - 22    ESTIMATED GFR >59 >59 mL/min/1.21m   PT/INR   Result Value Ref Range    PROTHROMBIN TIME 11.5 9.0 - 13.6 seconds    INR 1.02 0.80 - 1.20   PTT (PARTIAL THROMBOPLASTIN TIME)   Result Value Ref Range    APTT 41.4 (H) 25.1 - 36.5 seconds   TROPONIN-I (FOR ED ONLY)   Result Value Ref Range    TROPONIN I <7 0 - 30 ng/L   HIV1/HIV2 SCREEN, COMBINED ANTIGEN AND ANTIBODY   Result Value Ref Range    HIV SCREEN, COMBINED ANTIGEN & ANTIBODY Negative Negative, Indeterminate   LIPID PANEL   Result Value Ref Range    CHOLESTEROL 116 <200 mg/dL    HDL CHOL 29 (L) >=39 mg/dL    TRIGLYCERIDES 138 <=150 mg/dL    LDL CALC 59 <=100 mg/dL    VLDL CALC 28 <30 mg/dL    NON-HDL 87 <=190 mg/dL    CHOL/HDL RATIO 4.0    C-REACTIVE PROTEIN(CRP),HIGH SENSITIVITY,CARDIAC   Result Value Ref Range    CRP CARDIAC MARKER 1.5 (H) <=1.0 mg/L   CBC WITH DIFF   Result Value Ref Range    WBC 11.5 (H) 3.5 - 11.0 x103/uL    RBC 5.24 4.06 - 5.63 x106/uL    HGB 15.9 12.5 - 16.3 g/dL    HCT 45.5 36.7 - 47.0 %  MCV 86.8 78.0 - 100.0 fL    MCH 30.4 27.4 - 33.0 pg    MCHC 35.0 32.5 - 35.8 g/dL    RDW  13.1 12.0 - 15.0 %    PLATELETS 361 140 - 450 x103/uL    MPV 7.5 7.5 - 11.5 fL    NEUTROPHIL % 48 %    LYMPHOCYTE % 40 %    MONOCYTE % 9 %    EOSINOPHIL % 3 %    BASOPHIL % 1 %    NEUTROPHIL # 5.53 1.50 - 7.70 x103/uL    LYMPHOCYTE # 4.60 1.00 - 4.80 x103/uL    MONOCYTE # 1.02 (H) 0.30 - 1.00 x103/uL    EOSINOPHIL # 0.29 0.00 - 0.50 x103/uL    BASOPHIL # 0.05 0.00 - 0.20 x103/uL   SEDIMENTATION RATE   Result Value Ref Range    ERYTHROCYTE SEDIMENTATION RATE (ESR) 4 0 - 15 mm/hr       Imaging Studies:      I have reviewed the CXR image and my interpretation is no consolidation or acute process.    Results for orders placed or performed during the hospital encounter of 12/27/15 (from the past 24 hour(s))   XR AP MOBILE CHEST     Status: None (Preliminary result)    Narrative    Joshua Jefferson  Male, 30 years old.    XR AP MOBILE CHEST performed on 12/27/2015 12:10 AM.    REASON FOR EXAM:  Chest Pain    TECHNIQUE: 1 view/1 image(s) submitted for interpretation.    COMPARISON: None available.    FINDINGS:  No focal consolidation, pleural effusion, or pneumothorax is identified. The heart is of normal size.  No acute osseous abnormalities are appreciated.      Impression    No acute cardiopulmonary process.           DNR Status:  Full Code    TIMI Risk Score for Unstable Angina/NSTEMI:  None  Total TIMI Risk Score (1 point for each risk listed above): 0    HEART Score:  History moderately suspicious = 1 point  ECG normal = 0 points  Age < 45 = 0 points  Risk Factors: None = 0 points  Initial Troponin normal = 0 points  Total HEART Score: 1    Assessment/Plan:   Active Hospital Problems    Diagnosis    Atypical chest pain       30 y.o., White male with PMH prediabetes presents to [facility] ED with c/o atypical chest pain with associated dyspnea and nausea.    Atypical chest pain  - DDx pulmonary embolism vs costochondritis vs pleurisy vs esophageal spasm vs GERD vs cardiac  - family history of father with recurrent  DVT starting at 58 yo, recent long car trips with reported RLE edema/ecchymosis (since resolved) raises concern for PE  - improved with nitro in ED  - EKG without acute ischemic changes  - admit to observation  - trend troponin  - ordered lipid panel, A1C, cardiac CRP, and urine drug screen   - nitro PRN pain, O2 PRN  - continue ASA  - Percocet PRN pain  - ordered CT chest to evaluate for PE  - if rules out can consider exercise treadmill stress test if CT chest negative for PE    Multiple tattoos  - HIV screen negative  - check hepatitis C screen    DVT/PE Prophylaxis: Lovenox    50 minutes were spent  at the bedside and on the patient's hospital floor or unit.    Gerrit Friends, MD 12/27/2015, 03:37  Department of Internal Medicine  [facility]

## 2017-07-04 LAB — HIV1/HIV2 SCREEN, COMBINED ANTIGEN AND ANTIBODY: HIV SCREEN, COMBINED ANTIGEN & ANTIBODY: NEGATIVE

## 2017-11-03 ENCOUNTER — Encounter (INDEPENDENT_AMBULATORY_CARE_PROVIDER_SITE_OTHER): Payer: Self-pay

## 2017-11-03 ENCOUNTER — Ambulatory Visit
Admission: RE | Admit: 2017-11-03 | Discharge: 2017-11-03 | Disposition: A | Discharge status: Home / Self Care | Payer: BC Managed Care – PPO | Source: Ambulatory Visit

## 2017-11-03 ENCOUNTER — Ambulatory Visit (INDEPENDENT_AMBULATORY_CARE_PROVIDER_SITE_OTHER): Payer: BC Managed Care – PPO | Admitting: Family

## 2017-11-03 ENCOUNTER — Ambulatory Visit (INDEPENDENT_AMBULATORY_CARE_PROVIDER_SITE_OTHER)
Admission: RE | Admit: 2017-11-03 | Discharge: 2017-11-03 | Disposition: A | Discharge status: Home / Self Care | Payer: BC Managed Care – PPO | Source: Ambulatory Visit

## 2017-11-03 VITALS — BP 115/73 | HR 80 | Temp 98.4°F | Ht 67.0 in | Wt 175.0 lb

## 2017-11-03 DIAGNOSIS — Z6827 Body mass index (BMI) 27.0-27.9, adult: Secondary | ICD-10-CM

## 2017-11-03 DIAGNOSIS — M545 Low back pain, unspecified: Secondary | ICD-10-CM

## 2017-11-03 MED ORDER — METHOCARBAMOL 500 MG TABLET
500.00 mg | ORAL_TABLET | Freq: Three times a day (TID) | ORAL | 0 refills | Status: AC
Start: 2017-11-03 — End: 2017-11-17

## 2017-11-03 MED ORDER — DICLOFENAC SODIUM 50 MG TABLET,DELAYED RELEASE
50.00 mg | DELAYED_RELEASE_TABLET | Freq: Two times a day (BID) | ORAL | 0 refills | Status: AC
Start: 2017-11-03 — End: 2017-11-17

## 2017-11-03 MED ORDER — KETOROLAC 30 MG/ML (1 ML) INJECTION SOLUTION
60.0000 mg | Freq: Once | INTRAMUSCULAR | 0 refills | Status: AC
Start: 2017-11-03 — End: 2017-11-03
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
## 2017-11-03 NOTE — Nursing Note (Signed)
11/03/17 1100   Medication Administration   Initials BS   Medication  Ketorolac   Medication Dose 1 mL   Route of Administration IM   Site Right Hip   NDC # [id number]   LOT # [id number]   Expiration date 10/15/18   Manufacturer hospra   Clinic Supplied Yes   Patient Supplied No

## 2017-11-03 NOTE — Nursing Note (Signed)
11/03/17 1100   Depression Screen   Little interest or pleasure in doing things. 0   Feeling down, depressed, or hopeless 0   PHQ 2 Total 0

## 2018-01-02 IMAGING — CT CT Abdomen and Pelvis W-O Contrast
2 of 3 series · 16 of 46 positions shown, 18 images · IV contrast (agent unspecified)
Comparison: none

CT Abdomen and Pelvis W-O Contrast
INDICATION: Renal Calculi.                                                               
 Pertinent History: Right sided flank pain x 1 day HX stones                               
 Surgical History:                                                                         
 Cancer: None                                                                              
 Intravenous contrast: None                                                                
 Oral contrast: None                                                                       
 Comments: None
TECHNIQUE: Helical acquisition with sagittal and coronal reformats from the kidneys to    
 the bladder. Low dose technique specifically for detection and/or evaluation of           
 urolithiasis.                                                                             
 Utilized dose reduction techniques include: Automated Exposure Control, vendor specific   
 iterative reconstruction technique

[Series 2: ax pre (id) · axial · non-contrast · 0.80mm/px · z∈[-507,-126]mm · 13 of 147 slices shown, 15 images]
[im 10/147  soft-tissue]
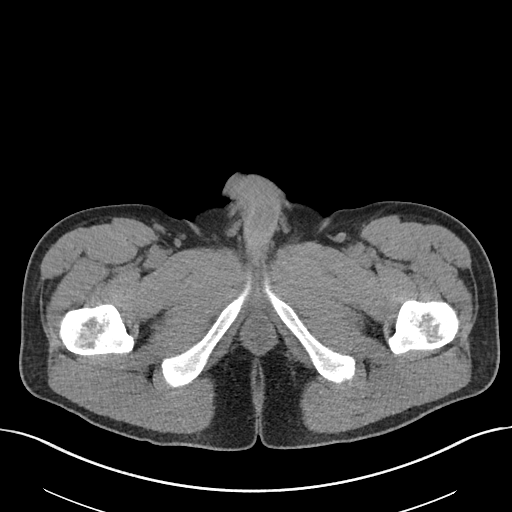
[im 10/147  bone]
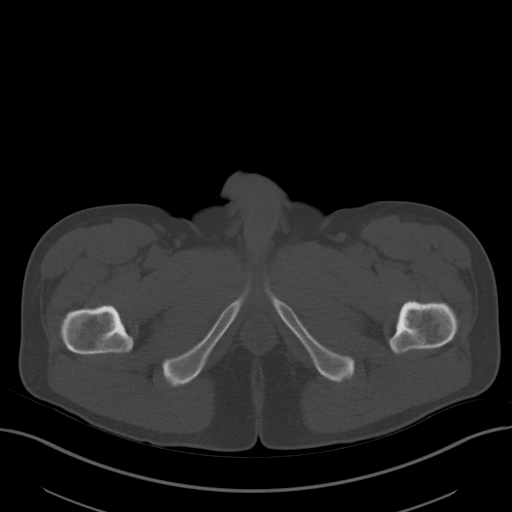
[im 19/147  soft-tissue]
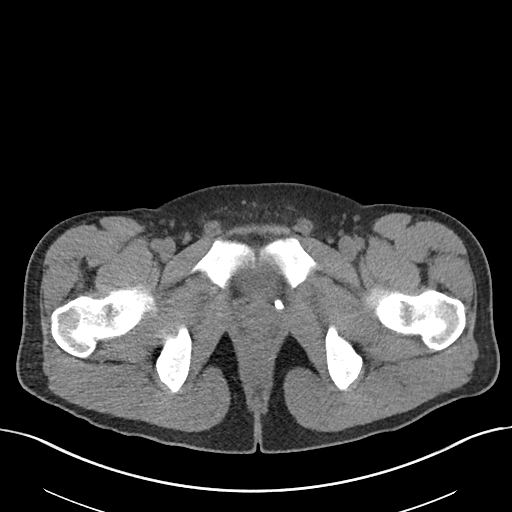
[im 29/147  soft-tissue]
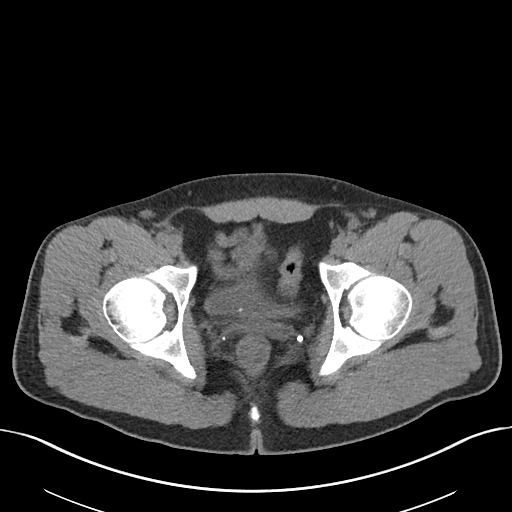
[im 43/147  soft-tissue]
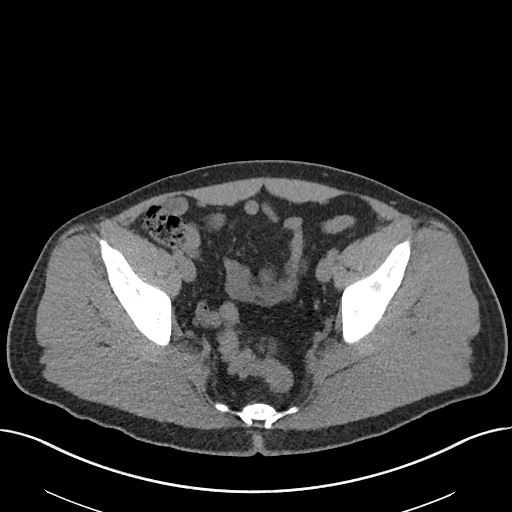
[im 52/147  soft-tissue]
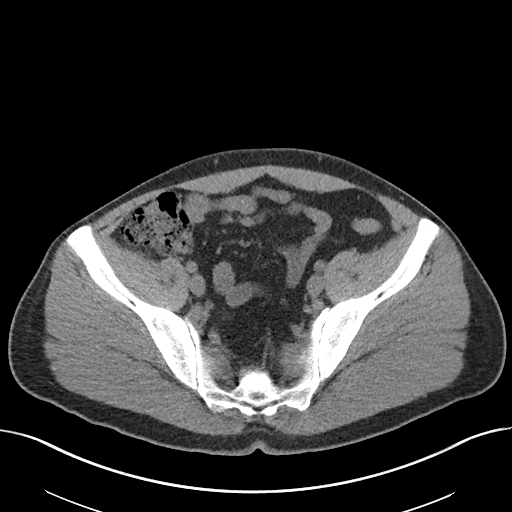
[im 62/147  soft-tissue]
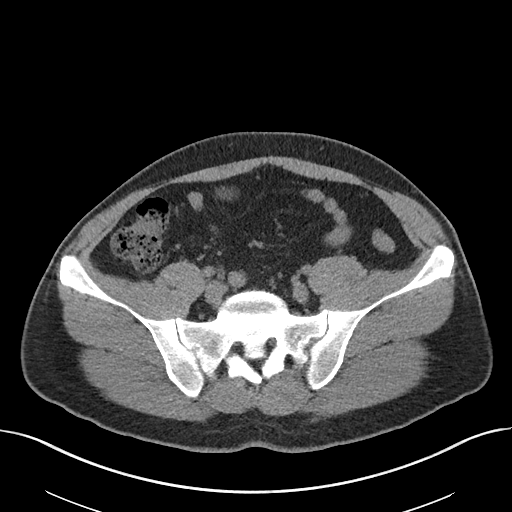
[im 76/147  soft-tissue]
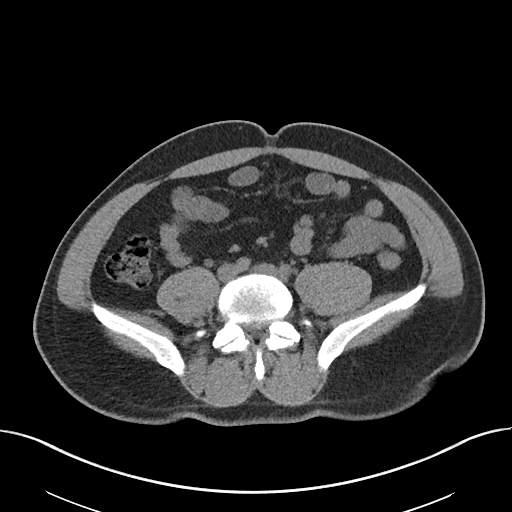
[im 85/147  soft-tissue]
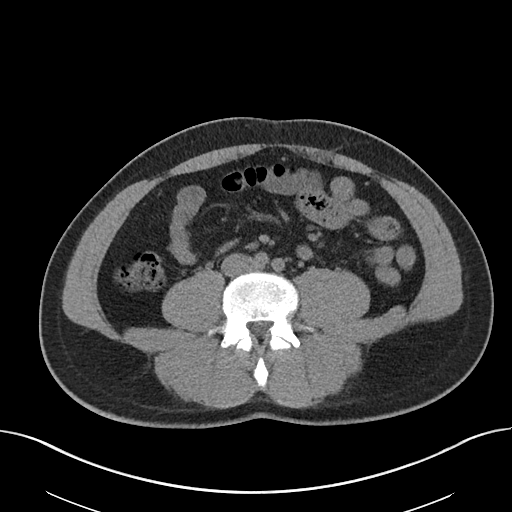
[im 95/147  soft-tissue]
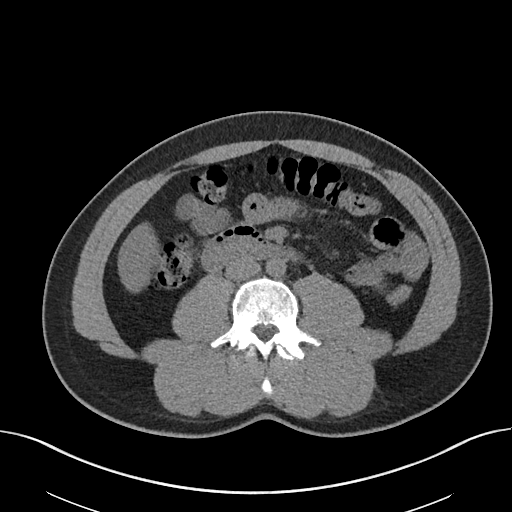
[im 95/147  bone]
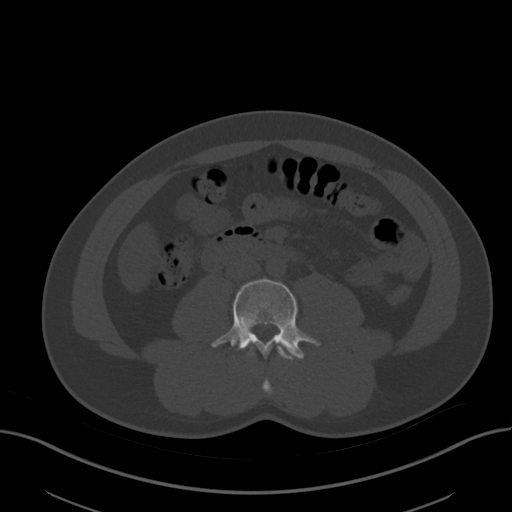
[im 104/147  soft-tissue]
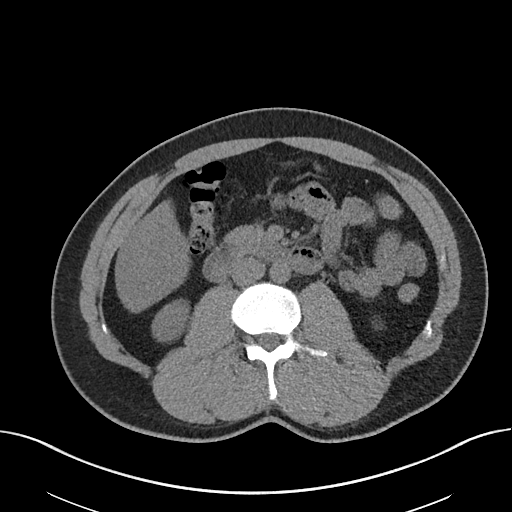
[im 118/147  soft-tissue]
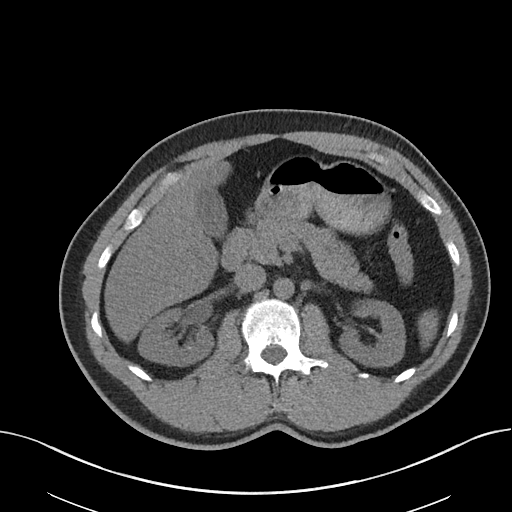
[im 128/147  soft-tissue]
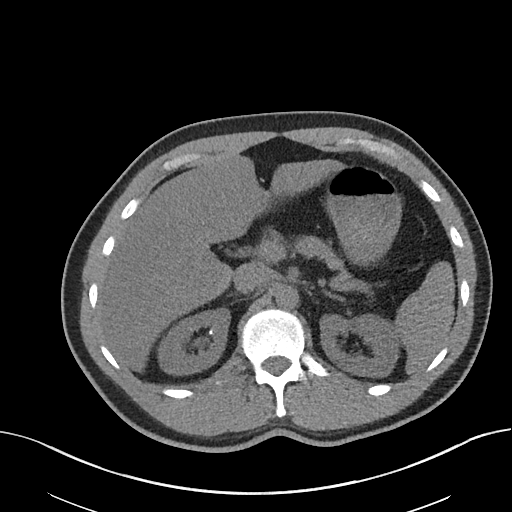
[im 137/147  soft-tissue]
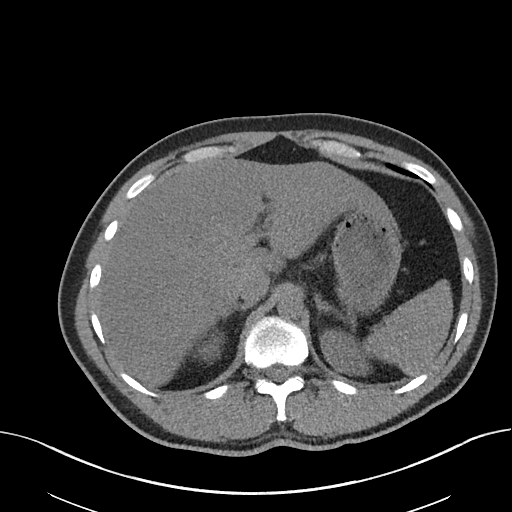

[Series 4: cor pre · coronal · non-contrast · 0.81mm/px · 3 of 93 slices shown]
[im 31/93  soft-tissue]
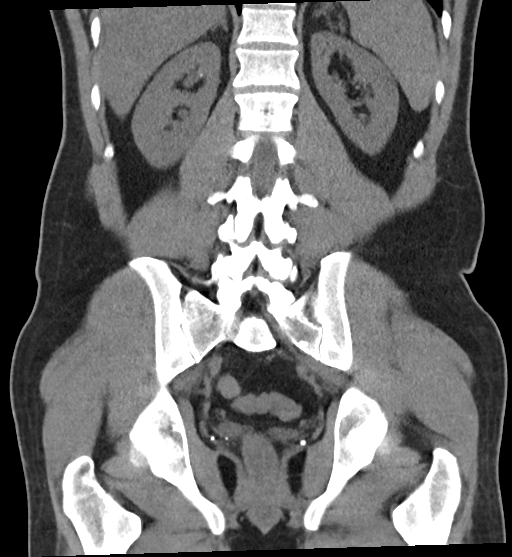
[im 41/93  soft-tissue]
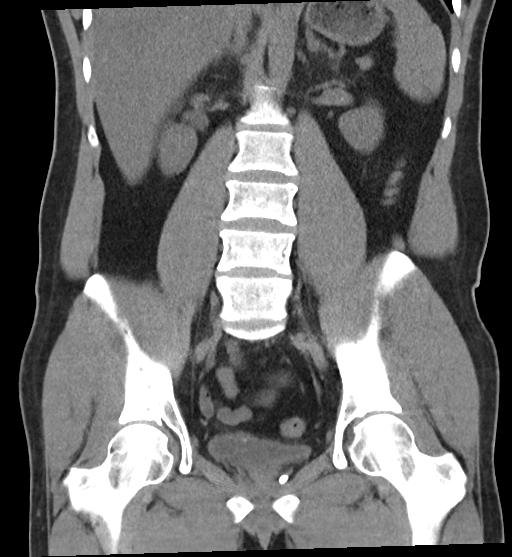
[im 52/93  soft-tissue]
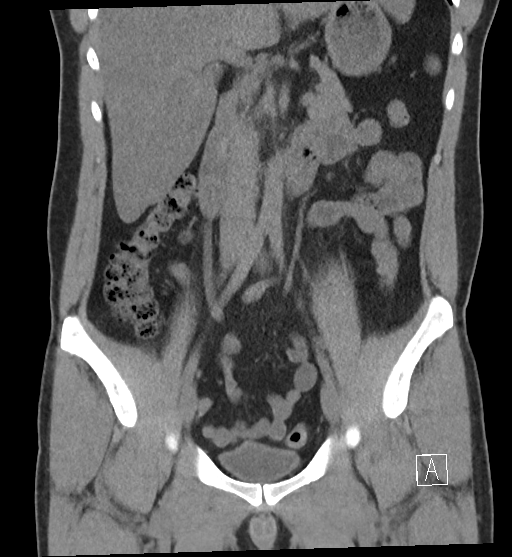

[16 of 46 positions shown; findings below may reference images not displayed]

FINDINGS: There are 3 small intrarenal calculi at the calyceal tips of the right kidney measuring   
 between 2 to 4 mm in size. Tiny punctate 1 mm left intrarenal calcification.              
 There is mild right hydronephrosis and right hydroureter. There is a 4.2 mm calcification 
 either at the level of the distal right ureter pooching into the urinary bladder versus   
 recently passed stone. Correlate with symptomatology. No left-sided hydronephrosis.       
 No bowel obstruction. Normal caliber abdominal aorta. Scanned portions of the solid       
 abdominal organs are unremarkable on this noncontrast scan. Tiny fatty umbilical          
 hernia.
IMPRESSION: 1. Mild right hydronephrosis and right hydroureter with a 4.2 mm calcification either in  
 the distal right ureter at the right ureterovesical junction/projecting into the urinary  
 bladder versus recently passed stone.                                                     
 2. Small bilateral intrarenal calculi.

## 2018-12-31 ENCOUNTER — Emergency Department (HOSPITAL_COMMUNITY): Payer: BC Managed Care – PPO

## 2018-12-31 ENCOUNTER — Emergency Department
Admission: EM | Admit: 2018-12-31 | Discharge: 2018-12-31 | Disposition: A | Discharge status: Home / Self Care | Payer: BC Managed Care – PPO | Attending: Emergency Medicine | Admitting: Emergency Medicine

## 2018-12-31 ENCOUNTER — Encounter (HOSPITAL_COMMUNITY): Payer: Self-pay

## 2018-12-31 ENCOUNTER — Other Ambulatory Visit: Payer: Self-pay

## 2018-12-31 DIAGNOSIS — Z87891 Personal history of nicotine dependence: Secondary | ICD-10-CM | POA: Insufficient documentation

## 2018-12-31 DIAGNOSIS — K047 Periapical abscess without sinus: Secondary | ICD-10-CM | POA: Insufficient documentation

## 2018-12-31 DIAGNOSIS — K0889 Other specified disorders of teeth and supporting structures: Secondary | ICD-10-CM

## 2018-12-31 MED ORDER — PENICILLIN V POTASSIUM 500 MG TABLET
500.00 mg | ORAL_TABLET | Freq: Four times a day (QID) | ORAL | 0 refills | Status: AC
Start: 2018-12-31 — End: 2019-01-07

## 2018-12-31 NOTE — ED Nurses Note (Signed)
Patient discharged home from ED with prescription. Discharge instructions discussed with patient, no questions noted.

## 2018-12-31 NOTE — ED Provider Notes (Signed)
CC:  Fever, dental pain  HPI:  Joshua Jefferson is a 33 y.o. male with no significant past medical history that presents with left-sided lower dental pain.  Patient states has a broken tooth on that side and the pain has gradually become worse.  Patient states he had a fever yesterday.  Patient denies any chest pain or shortness of breath.  Patient denies any neck pain.  Patient denies any trouble eating other than the pain on the left side of his jaw, he feels like he can still swallow and is not having any throat obstruction.    ROS: Except as in HPI  Constitutional: No fever or weakness   Skin: No rash or diaphoresis  HENT: No headaches or congestion  Eyes: No vision changes   Cardio: No chest pain, palpitations or leg swelling   Respiratory: No cough, wheezing or SOB  GI:  No nausea, vomiting, diarrhea, constipation  GU:  No dysuria, hematuria, polyuria  MSK: No joint or back pain  Neuro: No loss of sensation, confusion, focal deficits  Psychiatric: No mood changes  All other systems reviewed and are negative.    Medications:  Medications Prior to Admission     None          Allergies:  No Known Allergies  Allergies reviewed with patient    Past Medical History:   Diagnosis Date   . Prediabetes          History reviewed with patient    Past Surgical History:   Procedure Laterality Date   . HX APPENDECTOMY             Social History     Socioeconomic History   . Marital status: Single     Spouse name: Not on file   . Number of children: Not on file   . Years of education: Not on file   . Highest education level: Not on file   Occupational History   . Occupation: welder   Social Needs   . Financial resource strain: Not on file   . Food insecurity     Worry: Not on file     Inability: Not on file   . Transportation needs     Medical: Not on file     Non-medical: Not on file   Tobacco Use   . Smoking status: Former Smoker     Packs/day: 1.00     Years: 3.00     Pack years: 3.00     Last attempt to quit: 2005      Years since quitting: 15.5   . Smokeless tobacco: Former Estate agentUser   Substance and Sexual Activity   . Alcohol use: Yes     Comment: occasional   . Drug use: No   . Sexual activity: Not on file   Lifestyle   . Physical activity     Days per week: Not on file     Minutes per session: Not on file   . Stress: Not on file   Relationships   . Social Wellsite geologistconnections     Talks on phone: Not on file     Gets together: Not on file     Attends religious service: Not on file     Active member of club or organization: Not on file     Attends meetings of clubs or organizations: Not on file     Relationship status: Not on file   . Intimate partner violence  Fear of current or ex partner: Not on file     Emotionally abused: Not on file     Physically abused: Not on file     Forced sexual activity: Not on file   Other Topics Concern   . Abuse/Domestic Violence Not Asked   . Caffeine Concern Not Asked   . Calcium intake adequate Not Asked   . Computer Use Not Asked   . Drives Not Asked   . Exercise Concern Not Asked   . Helmet Use Not Asked   . Seat Belt Not Asked   . Special Diet Not Asked   . Sunscreen used Not Asked   . Uses Cane Not Asked   . Uses walker Not Asked   . Uses wheelchair Not Asked   . Right hand dominant Not Asked   . Left hand dominant Not Asked   . Ambidextrous Not Asked   . Shift Work Not Asked   . Unusual Sleep-Wake Schedule Not Asked   Social History Narrative    Works as Psychologist, occupationalwelder.       Family Medical History:     Problem Relation (Age of Onset)    Blood Clots Father 82(35)    Coronary Artery Disease Mother              Physical Exam:  All nurse's notes reviewed.  ED Triage Vitals   BP    Pulse    Resp    Temp    SpO2    Weight    Height      Constitutional: NAD. Oriented x4  HENT:   Head: Normocephalic and atraumatic.   Mouth/Throat: Oropharynx is clear and moist.  Patient has a broken tooth in the rear left lower dental jaw, no submandibular tenderness to palpation  Eyes: PERRL, EOMI, conjunctivae without discharge  bilaterally  Neck: Trachea midline.   Cardiovascular: RRR, No murmurs, rubs or gallops.   Pulmonary/Chest: BS equal bilaterally, good air movement. No respiratory distress. No wheezes, rales or chest tenderness.   Abdominal: BS +. Abdomen soft, no tenderness, rebound or guarding.               Musculoskeletal: No edema, tenderness or deformity.  Skin: warm and dry. No rash, erythema, pallor or cyanosis  Psychiatric: normal mood and affect. Behavior is normal.   Neurological: Alert&Ox3. Grossly intact.     Labs:  No results found for this or any previous visit (from the past 24 hour(s)).    Imaging:  None Indicated            Orders Placed This Encounter   . DENTAL - FOLLOW UP APPOINTMENT   . penicillin V potassium (VEETID) 500 mg Oral Tablet       Donzetta SprungShane R Dallan Schonberg, MD 12/31/2018, 13:12    Plan: Appropriate labs and imaging ordered. Medical Records reviewed.    Therapy/Procedures/Course/MDM:   -Patient was vitally stable throughout visit.    -Results were discussed with patient. They were given an opportunity to ask questions.  -patient appears to have a dental collection in the left lower jaw  -patient does not have a dentist that he uses  -[facility]  -antibiotic started  Consults:  None  Impression:  Dental infection  Disposition:        Following the above history, physical exam, and studies, the patient was deemed stable and suitable for discharge and [facility] .  Prescriptions were written for penicillin.  Medication instructions were discussed with the patient/patient's family. Patient was advised to return to the ED with any new, concerning or worsening symptoms and follow up as directed. The patient verbalized understanding of all instructions and had no further questions or concerns.

## 2018-12-31 NOTE — Discharge Instructions (Addendum)
Please return immediatly to the nearest ER with any new or concerning symptoms. Follow up with your primary care provider as soon as possible. Referrals have been made for you. Please follow up with the dental clinic department with the appointment that was made for you prior to discharge. They will call you with specific information once the appointment has been scheduled. If they do not notify you within the next 3 days, please contact them with the information provided with your discharge instructions. Thank you for letting me be a part of your treatment team today at [facility].

## 2018-12-31 NOTE — ED Triage Notes (Signed)
Dental pain with a fever since yesterday has taken tylenol and motrin rotating.

## 2022-05-28 ENCOUNTER — Other Ambulatory Visit (HOSPITAL_COMMUNITY): Payer: Self-pay | Admitting: ORTHOPAEDIC SURGERY

## 2022-05-28 DIAGNOSIS — S83242A Other tear of medial meniscus, current injury, left knee, initial encounter: Secondary | ICD-10-CM

## 2022-06-01 ENCOUNTER — Other Ambulatory Visit: Payer: Self-pay

## 2022-06-01 ENCOUNTER — Inpatient Hospital Stay
Admission: RE | Admit: 2022-06-01 | Discharge: 2022-06-01 | Disposition: A | Discharge status: Home / Self Care | Payer: Worker's Comp, Other unspecified | Source: Ambulatory Visit | Attending: ORTHOPAEDIC SURGERY | Admitting: ORTHOPAEDIC SURGERY

## 2022-06-01 DIAGNOSIS — S83242A Other tear of medial meniscus, current injury, left knee, initial encounter: Secondary | ICD-10-CM | POA: Insufficient documentation
